# Patient Record
Sex: Female | Born: 2000 | Race: White | Hispanic: No | Marital: Single | Smoking: Never smoker
Health system: Southern US, Community
[De-identification: ages and names within clinical notes are randomized; demographics above are authoritative.]

## PROBLEM LIST (undated history)

## (undated) ENCOUNTER — Emergency Department: Admission: EM | Payer: Medicaid Other

## (undated) VITALS — BP 106/73 | HR 103 | Temp 98.0°F | Resp 15 | Ht 61.0 in | Wt 91.5 lb

## (undated) DIAGNOSIS — F909 Attention-deficit hyperactivity disorder, unspecified type: Secondary | ICD-10-CM

## (undated) DIAGNOSIS — J45909 Unspecified asthma, uncomplicated: Secondary | ICD-10-CM

## (undated) DIAGNOSIS — F419 Anxiety disorder, unspecified: Secondary | ICD-10-CM

---
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
## 2021-11-20 IMAGING — US US OB < 14 WEEKS - US OB TV
1 series · 14 of 28 positions shown · non-contrast
Comparison: 01/08/2020

CLINICAL DATA: Vaginal bleeding in 1st trimester pregnancy. First
trimester pregnancy with inconclusive fetal viability.

EXAM:
OBSTETRIC <14 WK US AND TRANSVAGINAL OB US
TECHNIQUE: Both transabdominal and transvaginal ultrasound examinations were
performed for complete evaluation of the gestation as well as the
maternal uterus, adnexal regions, and pelvic cul-de-sac.
Transvaginal technique was performed to assess early pregnancy.

[Series 1: us ob less than 14 weeks with ob transvaginal · 14 of 180 slices shown]
[im 7/180]
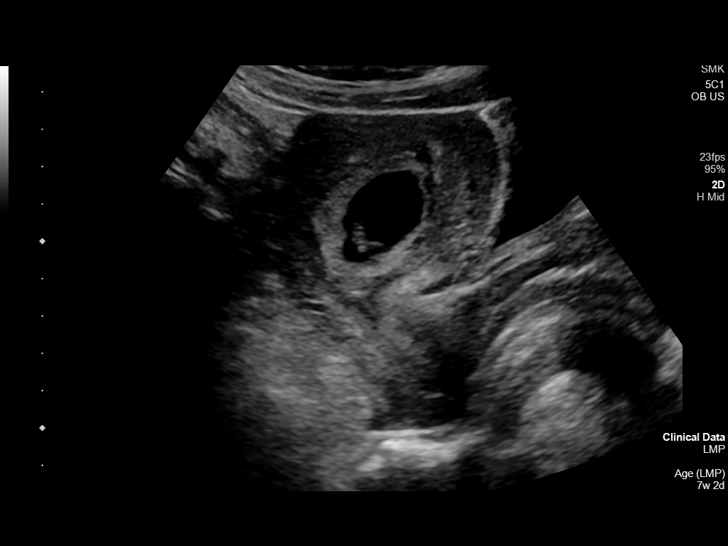
[im 20/180]
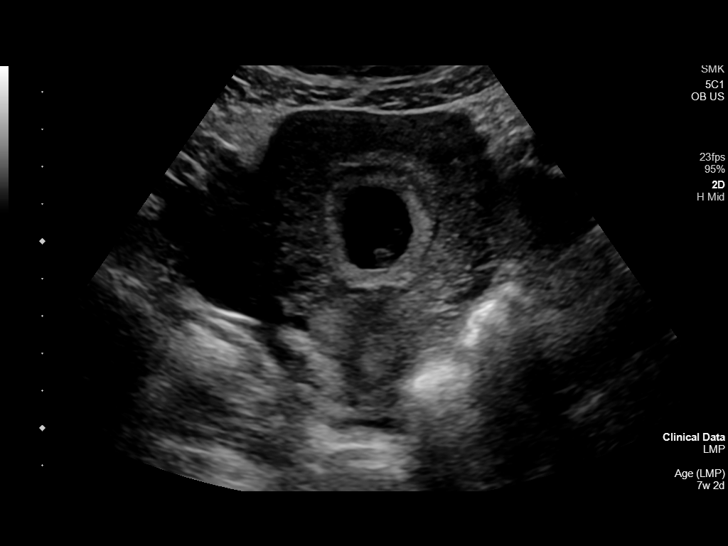
[im 34/180]
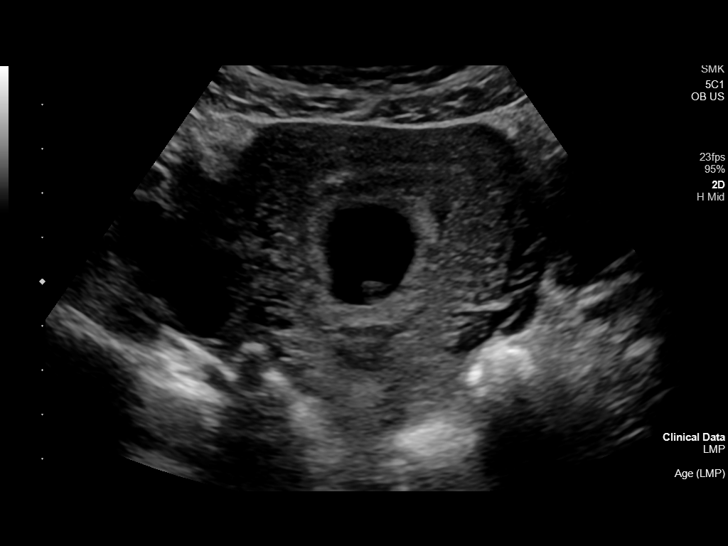
[im 47/180]
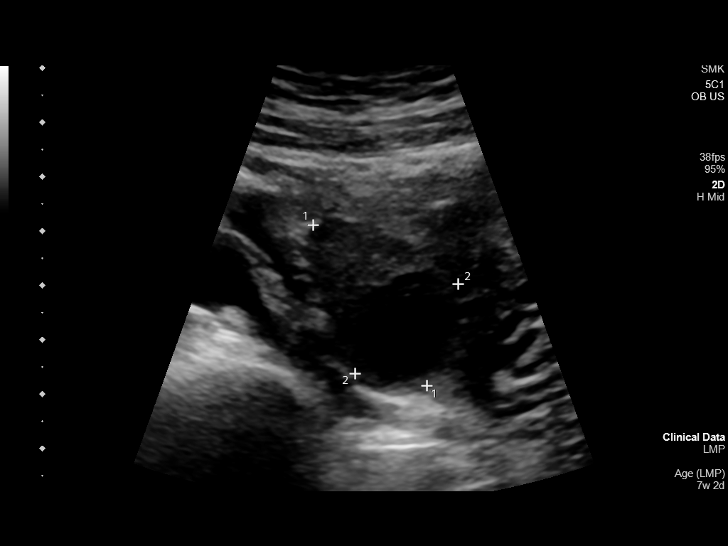
[im 60/180]
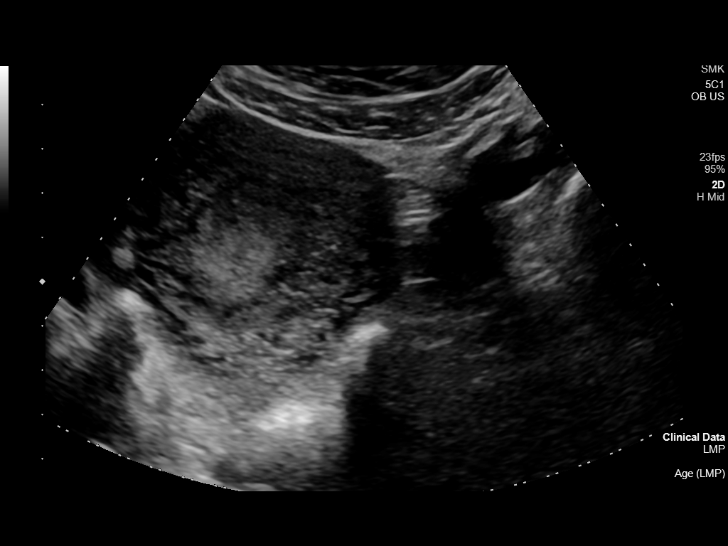
[im 73/180]
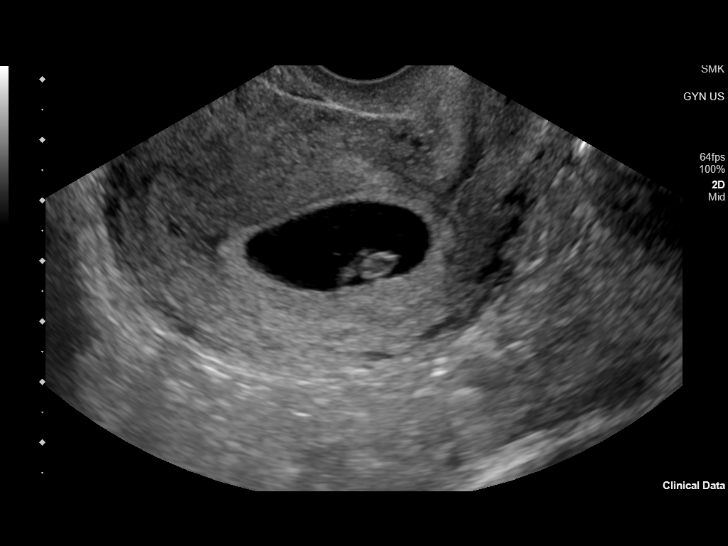
[im 87/180]
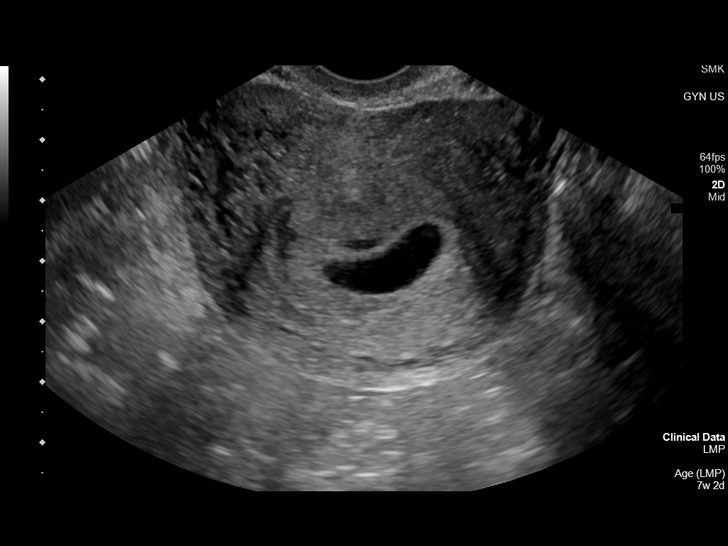
[im 100/180]
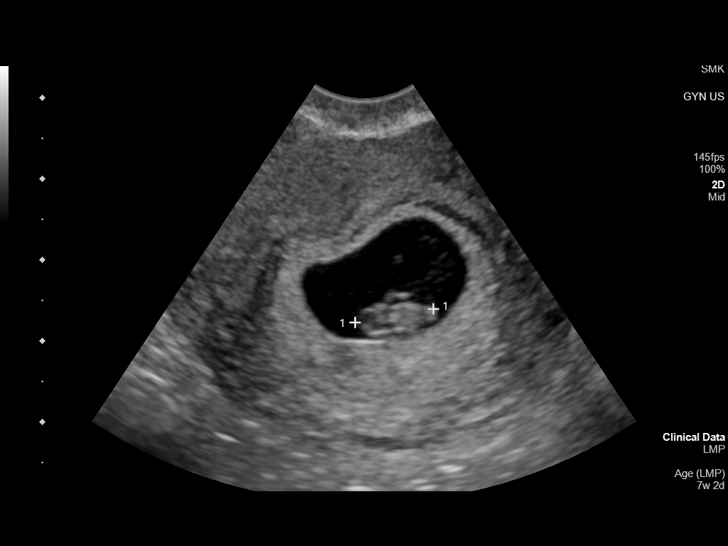
[im 113/180]
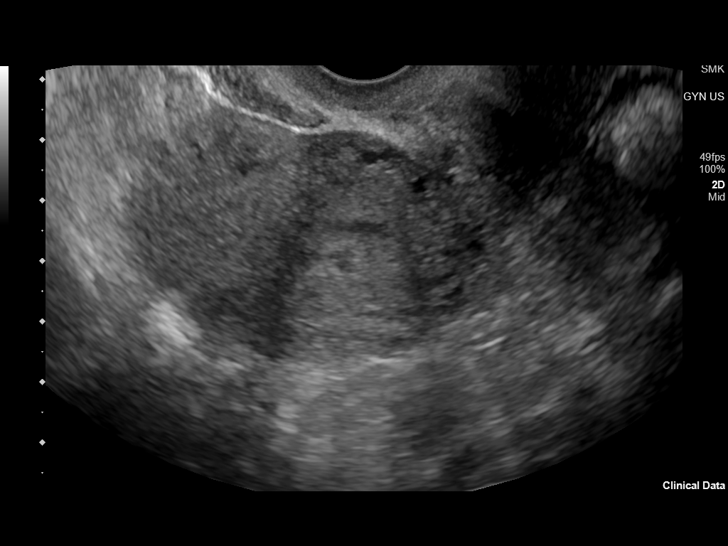
[im 126/180]
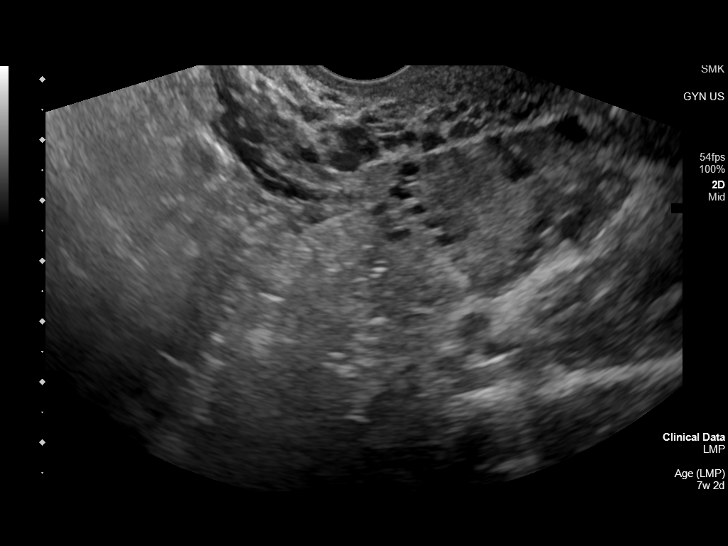
[im 140/180]
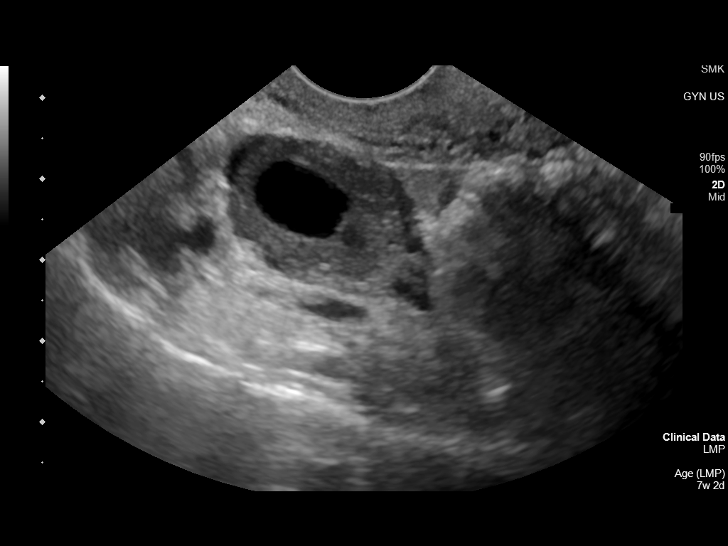
[im 153/180]
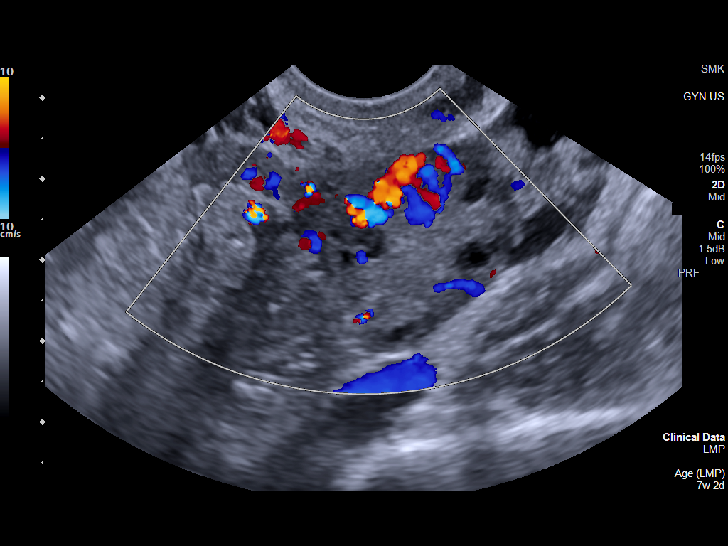
[im 166/180]
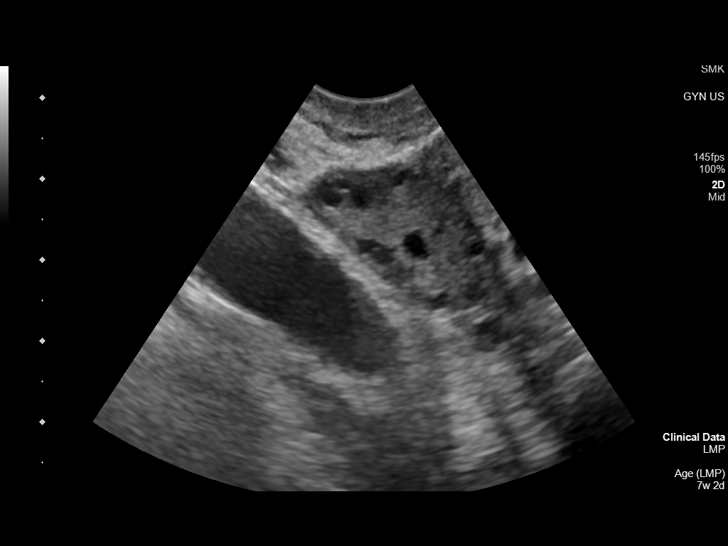
[im 180/180]
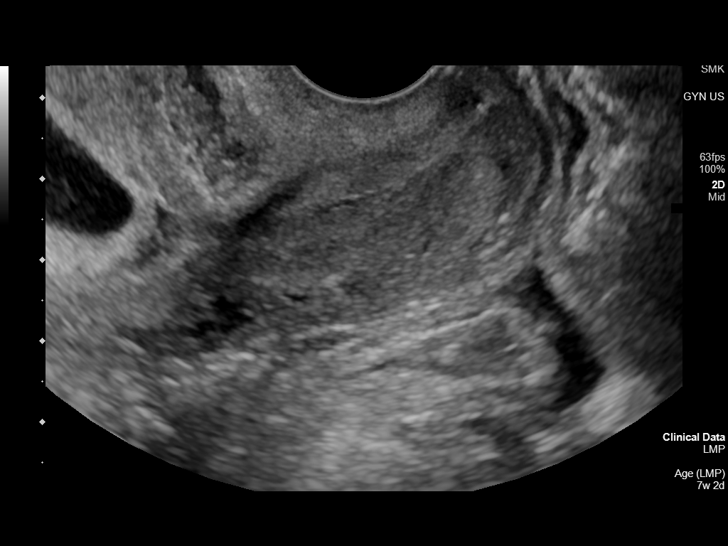

[14 of 28 positions shown; findings below may reference images not displayed]

FINDINGS: Intrauterine gestational sac: Single

Yolk sac:  Visualized.

Embryo:  Visualized.

Cardiac Activity: Visualized.

Heart Rate: 152 bpm

CRL:  10 mm   7 w   0 d                  US EDC: 09/05/2020

Subchorionic hemorrhage:  None visualized.

Maternal uterus/adnexae: Both ovaries are normal in appearance.
Small right ovarian corpus luteum cyst noted. No mass or abnormal
free fluid identified.
IMPRESSION: Single living IUP with estimated gestational age of 7 weeks 0 days,
and US EDC of 09/05/2020.

No maternal uterine or adnexal abnormality identified.

## 2022-01-25 ENCOUNTER — Encounter: Payer: Medicaid Other | Admitting: Certified Nurse Midwife

## 2022-03-06 ENCOUNTER — Emergency Department
Admission: EM | Admit: 2022-03-06 | Discharge: 2022-03-06 | Disposition: A | Discharge status: Home / Self Care | Payer: Medicaid Other | Attending: Emergency Medicine | Admitting: Emergency Medicine

## 2022-03-06 ENCOUNTER — Other Ambulatory Visit: Payer: Self-pay

## 2022-03-06 ENCOUNTER — Emergency Department: Payer: Medicaid Other

## 2022-03-06 ENCOUNTER — Encounter: Payer: Self-pay | Admitting: Emergency Medicine

## 2022-03-06 DIAGNOSIS — Z20822 Contact with and (suspected) exposure to covid-19: Secondary | ICD-10-CM | POA: Insufficient documentation

## 2022-03-06 DIAGNOSIS — B9789 Other viral agents as the cause of diseases classified elsewhere: Secondary | ICD-10-CM

## 2022-03-06 DIAGNOSIS — J069 Acute upper respiratory infection, unspecified: Secondary | ICD-10-CM | POA: Diagnosis not present

## 2022-03-06 DIAGNOSIS — J45909 Unspecified asthma, uncomplicated: Secondary | ICD-10-CM | POA: Diagnosis not present

## 2022-03-06 DIAGNOSIS — R509 Fever, unspecified: Secondary | ICD-10-CM | POA: Diagnosis present

## 2022-03-06 LAB — SARS CORONAVIRUS 2 BY RT PCR: SARS Coronavirus 2 by RT PCR: NEGATIVE

## 2022-03-06 MED ORDER — ALBUTEROL SULFATE HFA 108 (90 BASE) MCG/ACT IN AERS: 2.0000 | INHALATION_SPRAY | Freq: Four times a day (QID) | RESPIRATORY_TRACT | 2 refills | Status: AC | PRN

## 2022-03-06 MED ORDER — ONDANSETRON 4 MG PO TBDP
ORAL_TABLET | ORAL | Status: AC
  Filled 2022-03-06: qty 1

## 2022-03-06 MED ORDER — ONDANSETRON 4 MG PO TBDP: 4.0000 mg | ORAL_TABLET | Freq: Three times a day (TID) | ORAL | 0 refills | Status: AC | PRN

## 2022-03-06 MED ORDER — ONDANSETRON 4 MG PO TBDP
4.0000 mg | ORAL_TABLET | Freq: Once | ORAL | Status: AC
  Administered 2022-03-06: 4 mg via ORAL

## 2022-03-06 NOTE — ED Triage Notes (Signed)
Pt to ED from home c/o SOB x3 days, generalized body aches, fevers, chills, n/v.  States took home COVID test today that was negative.

## 2022-03-06 NOTE — ED Notes (Signed)
Lt green and lav tubes sent to lab with save labels.  No blood work at this time per Dr. Sidney Ace.

## 2022-03-06 NOTE — ED Notes (Signed)
Pt reports generalized body aches and fatigue. Also reports fevers and n/v. Pt reports home COVID test was negative. Pt noted to be ambulatory. Breathing is unlabored with symmetric chest rise and fall and pt speaking in full sentences. Pt is alert and oriented following commands appropriately. Denies chest pain or pressure.

## 2022-03-06 NOTE — Discharge Instructions (Addendum)
-  You may take Tylenol/ibuprofen as needed for body aches and fevers.  -You may take ondansetron as needed for nausea/vomiting.  -Follow-up with your primary care provider as needed.  -Return to the emergency department anytime if you begin to experience any new or worsening symptoms.

## 2022-03-06 NOTE — ED Provider Notes (Signed)
[facility] Provider Note    None    (approximate)   History   Chief Complaint Generalized Body Aches   HPI [name] is a 21 y.o. female, history of ADHD, somatic symptom disorder, GAD, MDD, asthma, presents the emergency department for evaluation of flulike symptoms.  Patient endorses body aches, fevers, chills, nausea/vomiting, and mild chest tightness.  Denies chest pain, abdominal pain, flank pain, dysuria, diarrhea, hematemesis, hematochezia, dizziness/lightheadedness, or rashes/lesions.   History Limitations: No limitations.        Physical Exam  Triage Vital Signs: ED Triage Vitals  Enc Vitals Group     BP 03/06/22 2209 118/86     Pulse Rate 03/06/22 2209 (!) 106     Resp 03/06/22 2209 18     Temp 03/06/22 2209 98.2 F (36.8 C)     Temp Source 03/06/22 2209 Oral     SpO2 03/06/22 2209 98 %     Weight 03/06/22 2159 135 lb (61.2 kg)     Height 03/06/22 2159 5\' 3"  (1.6 m)     Head Circumference --      Peak Flow --      Pain Score 03/06/22 2158 4     Pain Loc --      Pain Edu? --      Excl. in GC? --     Most recent vital signs: Vitals:   03/06/22 2209  BP: 118/86  Pulse: (!) 106  Resp: 18  Temp: 98.2 F (36.8 C)  SpO2: 98%    General: Awake, NAD.  Skin: Warm, dry. No rashes or lesions.  Eyes: PERRL. Conjunctivae normal.  CV: Good peripheral perfusion.  Resp: Normal effort.  Lung sounds are clear bilaterally in the apices and bases. Abd: Soft, non-tender. No distention.  Neuro: At baseline. No gross neurological deficits.   Focused Exam: N/A.  Physical Exam    ED Results / Procedures / Treatments  Labs (all labs ordered are listed, but only abnormal results are displayed) Labs Reviewed  SARS CORONAVIRUS 2 BY RT PCR     EKG N/A.   RADIOLOGY  ED Provider Interpretation: I personally viewed and interpreted this chest x-ray, no evidence of acute cardiopulmonary abnormalities.  DG Chest 2 View  Result  Date: 03/06/2022 CLINICAL DATA:  Shortness of breath and generalized body aches. EXAM: CHEST - 2 VIEW COMPARISON:  September 13, 2007 FINDINGS: The heart size and mediastinal contours are within normal limits. Both lungs are clear. The visualized skeletal structures are unremarkable. IMPRESSION: No active cardiopulmonary disease. Electronically Signed   By: September 15, 2007 M.D.   On: 03/06/2022 22:46    PROCEDURES:  Critical Care performed: N/A.  Procedures    MEDICATIONS ORDERED IN ED: Medications  ondansetron (ZOFRAN-ODT) disintegrating tablet 4 mg (has no administration in time range)     IMPRESSION / MDM / ASSESSMENT AND PLAN / ED COURSE  I reviewed the triage vital signs and the nursing notes.                              Differential diagnosis includes, but is not limited to, influenza, COVID-19, bronchitis, viral respiratory illness, allergic rhinitis, gastritis  ED Course Patient appears well, vitals within normal limits.  NAD.  COVID-19 PCR negative  Assessment/Plan Presentation consistent with viral syndrome.  COVID-19 PCR negative.  Chest x-ray shows no evidence of pneumonia.  She appears well clinically, though does endorse persistent nausea.  We will provide her with a prescription for ondansetron, as well as a albuterol inhaler.  Encouraged her to follow-up with her primary care provider as needed.  Will discharge.   Provided the patient with anticipatory guidance, return precautions, and educational material. Encouraged the patient to return to the emergency department at any time if they begin to experience any new or worsening symptoms. Patient expressed understanding and agreed with the plan.   Patient's presentation is most consistent with acute complicated illness / injury requiring diagnostic workup.     FINAL CLINICAL IMPRESSION(S) / ED DIAGNOSES   Final diagnoses:  Viral respiratory illness     Rx / DC Orders   ED Discharge Orders           Ordered    albuterol (VENTOLIN HFA) 108 (90 Base) MCG/ACT inhaler  Every 6 hours PRN        03/06/22 2312    ondansetron (ZOFRAN-ODT) 4 MG disintegrating tablet  Every 8 hours PRN        03/06/22 2312             Note:  This document was prepared using Dragon voice recognition software and may include unintentional dictation errors.   Varney Daily, [place] 03/06/22 2324    Georga Hacking, MD 03/07/22 [phone]

## 2022-03-08 ENCOUNTER — Other Ambulatory Visit: Payer: Self-pay

## 2022-03-08 ENCOUNTER — Emergency Department: Payer: Medicaid Other

## 2022-03-08 ENCOUNTER — Emergency Department
Admission: EM | Admit: 2022-03-08 | Discharge: 2022-03-08 | Disposition: A | Discharge status: Home / Self Care | Payer: Medicaid Other | Attending: Emergency Medicine | Admitting: Emergency Medicine

## 2022-03-08 DIAGNOSIS — N200 Calculus of kidney: Secondary | ICD-10-CM | POA: Diagnosis not present

## 2022-03-08 DIAGNOSIS — J45909 Unspecified asthma, uncomplicated: Secondary | ICD-10-CM | POA: Insufficient documentation

## 2022-03-08 DIAGNOSIS — N3000 Acute cystitis without hematuria: Secondary | ICD-10-CM

## 2022-03-08 DIAGNOSIS — N12 Tubulo-interstitial nephritis, not specified as acute or chronic: Secondary | ICD-10-CM

## 2022-03-08 DIAGNOSIS — R319 Hematuria, unspecified: Secondary | ICD-10-CM | POA: Diagnosis present

## 2022-03-08 DIAGNOSIS — N3001 Acute cystitis with hematuria: Secondary | ICD-10-CM | POA: Insufficient documentation

## 2022-03-08 LAB — URINALYSIS, ROUTINE W REFLEX MICROSCOPIC
Bilirubin Urine: NEGATIVE
Glucose, UA: NEGATIVE mg/dL
Ketones, ur: 80 mg/dL — AB
Nitrite: POSITIVE — AB
Protein, ur: 30 mg/dL — AB
Specific Gravity, Urine: >1.046 — ABNORMAL HIGH (ref 1.005–1.030)
pH: 6 (ref 5.0–8.0)

## 2022-03-08 LAB — CBC WITH DIFFERENTIAL/PLATELET
Abs Immature Granulocytes: 0.07 10*3/uL (ref 0.00–0.07)
Basophils Absolute: 0 10*3/uL (ref 0.0–0.1)
Basophils Relative: 0 %
Eosinophils Absolute: 0.2 10*3/uL (ref 0.0–0.5)
Eosinophils Relative: 2 %
HCT: 38.5 % (ref 36.0–46.0)
Hemoglobin: 12.9 g/dL (ref 12.0–15.0)
Immature Granulocytes: 1 %
Lymphocytes Relative: 11 %
Lymphs Abs: 1 10*3/uL (ref 0.7–4.0)
MCH: 27.4 pg (ref 26.0–34.0)
MCHC: 33.5 g/dL (ref 30.0–36.0)
MCV: 81.7 fL (ref 80.0–100.0)
Monocytes Absolute: 0.8 10*3/uL (ref 0.1–1.0)
Monocytes Relative: 10 %
Neutro Abs: 6.5 10*3/uL (ref 1.7–7.7)
Neutrophils Relative %: 76 %
Platelets: 256 10*3/uL (ref 150–400)
RBC: 4.71 MIL/uL (ref 3.87–5.11)
RDW: 12.3 % (ref 11.5–15.5)
WBC: 8.6 10*3/uL (ref 4.0–10.5)
nRBC: 0 % (ref 0.0–0.2)

## 2022-03-08 LAB — COMPREHENSIVE METABOLIC PANEL
ALT: 35 U/L (ref 0–44)
AST: 25 U/L (ref 15–41)
Albumin: 3.5 g/dL (ref 3.5–5.0)
Alkaline Phosphatase: 88 U/L (ref 38–126)
Anion gap: 14 (ref 5–15)
BUN: 11 mg/dL (ref 6–20)
CO2: 19 mmol/L — ABNORMAL LOW (ref 22–32)
Calcium: 8.7 mg/dL — ABNORMAL LOW (ref 8.9–10.3)
Chloride: 102 mmol/L (ref 98–111)
Creatinine, Ser: 0.7 mg/dL (ref 0.44–1.00)
GFR, Estimated: >60 mL/min (ref >60)
Glucose, Bld: 118 mg/dL — ABNORMAL HIGH (ref 70–99)
Potassium: 3.3 mmol/L — ABNORMAL LOW (ref 3.5–5.1)
Sodium: 135 mmol/L (ref 135–145)
Total Bilirubin: 1.1 mg/dL (ref 0.3–1.2)
Total Protein: 7.3 g/dL (ref 6.5–8.1)

## 2022-03-08 LAB — TROPONIN I (HIGH SENSITIVITY): Troponin I (High Sensitivity): 4 ng/L (ref <18)

## 2022-03-08 LAB — HCG, QUANTITATIVE, PREGNANCY: hCG, Beta Chain, Quant, S: <1 m[IU]/mL (ref <5)

## 2022-03-08 LAB — LACTIC ACID, PLASMA: Lactic Acid, Venous: 0.7 mmol/L (ref 0.5–1.9)

## 2022-03-08 MED ORDER — SULFAMETHOXAZOLE-TRIMETHOPRIM 800-160 MG PO TABS
1.0000 | ORAL_TABLET | Freq: Once | ORAL | Status: AC
  Administered 2022-03-08: 1 via ORAL
  Filled 2022-03-08: qty 1

## 2022-03-08 MED ORDER — SODIUM CHLORIDE 0.9 % IV SOLN: 12.5000 mg | Freq: Four times a day (QID) | INTRAVENOUS | Status: DC | PRN

## 2022-03-08 MED ORDER — IOHEXOL 300 MG/ML  SOLN
100.0000 mL | Freq: Once | INTRAMUSCULAR | Status: AC | PRN
  Administered 2022-03-08: 100 mL via INTRAVENOUS

## 2022-03-08 MED ORDER — PROMETHAZINE HCL 25 MG PO TABS: 25.0000 mg | ORAL_TABLET | Freq: Four times a day (QID) | ORAL | 0 refills | Status: DC | PRN

## 2022-03-08 MED ORDER — LACTATED RINGERS IV BOLUS
1000.0000 mL | Freq: Once | INTRAVENOUS | Status: AC
  Administered 2022-03-08: 1000 mL via INTRAVENOUS

## 2022-03-08 MED ORDER — SULFAMETHOXAZOLE-TRIMETHOPRIM 800-160 MG PO TABS: 1.0000 | ORAL_TABLET | Freq: Two times a day (BID) | ORAL | 0 refills | Status: AC

## 2022-03-08 MED ORDER — IBUPROFEN 600 MG PO TABS
600.0000 mg | ORAL_TABLET | Freq: Once | ORAL | Status: AC
  Administered 2022-03-08: 600 mg via ORAL
  Filled 2022-03-08: qty 1

## 2022-03-08 MED ORDER — SODIUM CHLORIDE 0.9 % IV BOLUS
1000.0000 mL | Freq: Once | INTRAVENOUS | Status: AC
  Administered 2022-03-08: 1000 mL via INTRAVENOUS

## 2022-03-08 MED ORDER — MORPHINE SULFATE (PF) 4 MG/ML IV SOLN
4.0000 mg | Freq: Once | INTRAVENOUS | Status: AC
  Administered 2022-03-08: 4 mg via INTRAVENOUS
  Filled 2022-03-08: qty 1

## 2022-03-08 NOTE — ED Notes (Signed)
Transported to CT 

## 2022-03-08 NOTE — ED Triage Notes (Addendum)
Pt presents to ER with c/o body aches, hematuria, and fever that started Friday last week.  Pt states symptoms started when her period started.  Pt states pain is in left groin area.  Pt denies sore throat, cough, loss of smell or taste.  Pt states she has also had much less frequent urination.  Pt is A&O x4 at this time, and appears lethargic in triage.  Pt states she took tylenol at 1700 today.

## 2022-03-08 NOTE — Discharge Instructions (Addendum)
Your tests today reveal a urinary tract infection which is involving your bladder and right kidney.  Take Bactrim as prescribed, 2 times a day, to eradicate this infection.  Use promethazine as needed for nausea.  Take ibuprofen 600 mg and Tylenol 675 mg every 6 hours as needed for pain or fever.

## 2022-03-08 NOTE — ED Notes (Signed)
Beverage and crackers provided for PO challenge

## 2022-03-08 NOTE — ED Notes (Signed)
PO challenge successful.

## 2022-03-08 NOTE — ED Provider Notes (Signed)
[facility] Provider Note    Event Date/Time   First MD Initiated Contact with Patient 03/08/22 2004     (approximate)   History   Chief Complaint: Hematuria   HPI  [name] is a 21 y.o. female with a history of ADHD, asthma, anxiety who comes the ED complaining of body aches, hematuria, fever for the past 5 days, gradual onset.  Pain is worse in the right flank.  Also reports some nausea and vomiting but tolerating liquids.  No chest pain shortness of breath or cough.  She does have dysuria and urgency.  She was seen in the ED 2 days ago, had reassuring work-up, COVID test negative.     Physical Exam   Triage Vital Signs: ED Triage Vitals  Enc Vitals Group     BP 03/08/22 1950 109/67     Pulse Rate 03/08/22 1950 (!) 162     Resp 03/08/22 1950 (!) 26     Temp 03/08/22 1950 (!) 103.3 F (39.6 C)     Temp Source 03/08/22 1950 Oral     SpO2 03/08/22 1950 95 %     Weight --      Height --      Head Circumference --      Peak Flow --      Pain Score 03/08/22 1953 9     Pain Loc --      Pain Edu? --      Excl. in GC? --     Most recent vital signs: Vitals:   03/08/22 2216 03/08/22 2230  BP:  100/62  Pulse:  89  Resp:  (!) 39  Temp: 99.8 F (37.7 C)   SpO2:  98%    General: Awake, no distress.  CV:  Good peripheral perfusion.  Tachycardia heart rate 120.  No murmur.  Symmetric distal pulses Resp:  Normal effort.  Clear to auscultation bilaterally.  Respiratory rate 18 Abd:  No distention.  Soft, right-sided abdominal tenderness and suprapubic tenderness.  Right CVA tenderness. Other:  Dry mucous membranes.  No rash.   ED Results / Procedures / Treatments   Labs (all labs ordered are listed, but only abnormal results are displayed) Labs Reviewed  COMPREHENSIVE METABOLIC PANEL - Abnormal; Notable for the following components:      Result Value   Potassium 3.3 (*)    CO2 19 (*)    Glucose, Bld 118 (*)    Calcium 8.7 (*)     All other components within normal limits  URINALYSIS, ROUTINE W REFLEX MICROSCOPIC - Abnormal; Notable for the following components:   Color, Urine YELLOW (*)    APPearance CLEAR (*)    Specific Gravity, Urine >1.046 (*)    Hgb urine dipstick SMALL (*)    Ketones, ur 80 (*)    Protein, ur 30 (*)    Nitrite POSITIVE (*)    Leukocytes,Ua SMALL (*)    Bacteria, UA RARE (*)    All other components within normal limits  CULTURE, BLOOD (ROUTINE X 2)  CULTURE, BLOOD (ROUTINE X 2)  URINE CULTURE  CBC WITH DIFFERENTIAL/PLATELET  LACTIC ACID, PLASMA  HCG, QUANTITATIVE, PREGNANCY  TROPONIN I (HIGH SENSITIVITY)     EKG Interpreted by me Sinus tachycardia rate 136.  Normal axis and intervals.  Normal QRS ST segments and T waves.  Repeat EKG shows sinus tachycardia rate 114.  No acute interval change.   RADIOLOGY CT abdomen pelvis interpreted by me, negative for appendicitis,  intra-abdominal abscess, or obstructing ureterolithiasis.  Radiology report reviewed, noting signs of right pyelonephritis.   PROCEDURES:  Procedures   MEDICATIONS ORDERED IN ED: Medications  promethazine (PHENERGAN) 12.5 mg in sodium chloride 0.9 % 50 mL IVPB (has no administration in time range)  ibuprofen (ADVIL) tablet 600 mg (600 mg Oral Given 03/08/22 2021)  lactated ringers bolus 1,000 mL (1,000 mLs Intravenous New Bag/Given 03/08/22 2116)  morphine (PF) 4 MG/ML injection 4 mg (4 mg Intravenous Given 03/08/22 2115)  iohexol (OMNIPAQUE) 300 MG/ML solution 100 mL (100 mLs Intravenous Contrast Given 03/08/22 2103)  sodium chloride 0.9 % bolus 1,000 mL (1,000 mLs Intravenous New Bag/Given 03/08/22 2146)  sulfamethoxazole-trimethoprim (BACTRIM DS) 800-160 MG per tablet 1 tablet (1 tablet Oral Given 03/08/22 2143)     IMPRESSION / MDM / ASSESSMENT AND PLAN / ED COURSE  I reviewed the triage vital signs and the nursing notes.                              Differential diagnosis includes, but is not limited to,  UTI, pyelonephritis, appendicitis, viral illness, dehydration, AKI, electrolyte abnormality  Patient's presentation is most consistent with acute presentation with potential threat to life or bodily function.  Patient presents with abdominal pain with abnormal abdominal exam, fever tachycardia.  She is nontoxic, not septic.  With IV fluids and supportive care, vital signs have normalized.  Labs and CT show cystitis and pyelonephritis.  Serum labs are reassuring, pregnancy is negative.  She is tolerating oral intake, pain is controlled, started on Bactrim (due to Keflex allergy).  Urine culture sent.  Due to improvement of symptoms and ability to take medications and stay hydrated at home, she does not require admission.  Suitable for primary care follow-up.       FINAL CLINICAL IMPRESSION(S) / ED DIAGNOSES   Final diagnoses:  Acute cystitis without hematuria  Pyelonephritis     Rx / DC Orders   ED Discharge Orders          Ordered    sulfamethoxazole-trimethoprim (BACTRIM DS) 800-160 MG tablet  2 times daily        03/08/22 2246    promethazine (PHENERGAN) 25 MG tablet  Every 6 hours PRN        03/08/22 2246             Note:  This document was prepared using Dragon voice recognition software and may include unintentional dictation errors.   Sharman Cheek, MD 03/08/22 2253

## 2022-03-11 LAB — URINE CULTURE: Culture: >=100000 — AB

## 2022-03-14 LAB — CULTURE, BLOOD (ROUTINE X 2)
Culture: NO GROWTH
Culture: NO GROWTH
Special Requests: ADEQUATE
Special Requests: ADEQUATE

## 2023-03-08 ENCOUNTER — Other Ambulatory Visit: Payer: Self-pay

## 2023-03-08 ENCOUNTER — Ambulatory Visit (INDEPENDENT_AMBULATORY_CARE_PROVIDER_SITE_OTHER): Payer: Medicaid Other

## 2023-03-08 VITALS — BP 110/69 | HR 87 | Ht 63.0 in | Wt 194.6 lb

## 2023-03-08 DIAGNOSIS — O200 Threatened abortion: Secondary | ICD-10-CM

## 2023-03-08 DIAGNOSIS — Z3201 Encounter for pregnancy test, result positive: Secondary | ICD-10-CM

## 2023-03-08 DIAGNOSIS — O209 Hemorrhage in early pregnancy, unspecified: Secondary | ICD-10-CM

## 2023-03-08 DIAGNOSIS — Z3481 Encounter for supervision of other normal pregnancy, first trimester: Secondary | ICD-10-CM

## 2023-03-08 DIAGNOSIS — N912 Amenorrhea, unspecified: Secondary | ICD-10-CM

## 2023-03-08 DIAGNOSIS — O3680X0 Pregnancy with inconclusive fetal viability, not applicable or unspecified: Secondary | ICD-10-CM

## 2023-03-08 DIAGNOSIS — Z3200 Encounter for pregnancy test, result unknown: Secondary | ICD-10-CM

## 2023-03-08 LAB — POCT URINE PREGNANCY: Preg Test, Ur: POSITIVE — AB

## 2023-03-08 NOTE — Progress Notes (Signed)
    NURSE VISIT NOTE  Subjective:    Patient ID: [name], female    DOB: 2001-05-16, 22 y.o.   MRN: [id number]  HPI  Patient is a 22 y.o. G53P0010 female who presents for evaluation of amenorrhea. She believes she could be pregnant. Patient is ambivalent about pregnancy. Sexual Activity: single partner, contraception: none. Current symptoms also include: positive home pregnancy test. Last period was normal. Patient states light bleeding lasting from 03/03/23-03/04/23 along with cramping, this has caused her some stress.     Objective:    BP 110/69   Pulse 87   Ht 5\' 3"  (1.6 m)   Wt 194 lb 9.6 oz (88.3 kg)   LMP 01/16/2023 (Approximate)   BMI 34.47 kg/m   Lab Review  Results for orders placed or performed in visit on 03/08/23  POCT urine pregnancy  Result Value Ref Range   Preg Test, Ur Positive (A) Negative    Assessment:   1. Possible pregnancy, not yet confirmed   2. Bleeding in early pregnancy   3. Bloody show and cramping in early pregnancy   4. Pregnancy with uncertain fetal viability, single or unspecified fetus   5. Encounter for supervision of other normal pregnancy in first trimester     Plan:   Pregnancy Test: Positive  Estimated Date of Delivery: 10/23/23 Encouraged well-balanced diet, plenty of rest when needed, pre-natal vitamins daily and walking for exercise.  Discussed self-help for nausea, avoiding OTC medications until consulting provider or pharmacist, other than Tylenol as needed, minimal caffeine (1-2 cups daily) and avoiding alcohol.   She will schedule her nurse visit @ 7-[redacted] wks pregnant, u/s for dating @10  wk, and NOB visit at [redacted] wk pregnant.    Feel free to call with any questions.     Loman Chroman, CMA

## 2023-03-09 LAB — BETA HCG QUANT (REF LAB): hCG Quant: 23447 m[IU]/mL

## 2023-03-11 ENCOUNTER — Other Ambulatory Visit: Payer: Medicaid Other

## 2023-03-11 DIAGNOSIS — O209 Hemorrhage in early pregnancy, unspecified: Secondary | ICD-10-CM

## 2023-03-11 DIAGNOSIS — Z3481 Encounter for supervision of other normal pregnancy, first trimester: Secondary | ICD-10-CM

## 2023-03-11 DIAGNOSIS — O200 Threatened abortion: Secondary | ICD-10-CM

## 2023-03-12 ENCOUNTER — Other Ambulatory Visit: Payer: Self-pay | Admitting: Obstetrics

## 2023-03-12 ENCOUNTER — Ambulatory Visit (INDEPENDENT_AMBULATORY_CARE_PROVIDER_SITE_OTHER): Payer: Medicaid Other

## 2023-03-12 DIAGNOSIS — Z3687 Encounter for antenatal screening for uncertain dates: Secondary | ICD-10-CM

## 2023-03-12 DIAGNOSIS — Z3200 Encounter for pregnancy test, result unknown: Secondary | ICD-10-CM

## 2023-03-12 DIAGNOSIS — O3680X0 Pregnancy with inconclusive fetal viability, not applicable or unspecified: Secondary | ICD-10-CM

## 2023-03-12 DIAGNOSIS — Z3A01 Less than 8 weeks gestation of pregnancy: Secondary | ICD-10-CM | POA: Diagnosis not present

## 2023-03-12 DIAGNOSIS — O209 Hemorrhage in early pregnancy, unspecified: Secondary | ICD-10-CM

## 2023-03-12 DIAGNOSIS — Z3481 Encounter for supervision of other normal pregnancy, first trimester: Secondary | ICD-10-CM

## 2023-03-12 DIAGNOSIS — O200 Threatened abortion: Secondary | ICD-10-CM

## 2023-03-12 LAB — BETA HCG QUANT (REF LAB): hCG Quant: 40303 m[IU]/mL

## 2023-03-13 ENCOUNTER — Encounter: Payer: Self-pay | Admitting: Obstetrics

## 2023-03-19 ENCOUNTER — Telehealth: Payer: Self-pay

## 2023-03-19 NOTE — Telephone Encounter (Signed)
Pt calling to see what she can take for a cold; also taking rx for UTI.  [phone] Adv per Safe Meds list; encouraged pt to go get tested for covid, RSV, and flu; tx sxs; if has covid to call for rx of paxlovid.

## 2023-03-20 ENCOUNTER — Other Ambulatory Visit: Payer: Self-pay | Admitting: Obstetrics

## 2023-03-20 NOTE — Telephone Encounter (Signed)
Pt calling triage following up on yesterday's phone call. She did a home COVID test and she had positive result. She called the after hours nurse line to tell them this and she was advised to go to ED, which she didn't cause she couldn't go. Only symptoms she is having is congestion and sore throat, and some mild fevers. Can she get Rx that was advised we would call in if she tested positive for COVID?

## 2023-03-20 NOTE — Telephone Encounter (Signed)
Spoke to MMF, she will call pharmacy to give verbal since she was having trouble sending Rx. Pt aware to start Rx today.

## 2023-03-26 ENCOUNTER — Emergency Department: Payer: Medicaid Other

## 2023-03-26 ENCOUNTER — Other Ambulatory Visit: Payer: Self-pay

## 2023-03-26 ENCOUNTER — Encounter: Payer: Self-pay | Admitting: Emergency Medicine

## 2023-03-26 ENCOUNTER — Emergency Department
Admission: EM | Admit: 2023-03-26 | Discharge: 2023-03-27 | Disposition: A | Discharge status: Home / Self Care | Payer: Medicaid Other | Attending: Emergency Medicine | Admitting: Emergency Medicine

## 2023-03-26 DIAGNOSIS — O26891 Other specified pregnancy related conditions, first trimester: Secondary | ICD-10-CM | POA: Diagnosis present

## 2023-03-26 DIAGNOSIS — Z3A08 8 weeks gestation of pregnancy: Secondary | ICD-10-CM | POA: Diagnosis not present

## 2023-03-26 DIAGNOSIS — N898 Other specified noninflammatory disorders of vagina: Secondary | ICD-10-CM | POA: Diagnosis not present

## 2023-03-26 DIAGNOSIS — R1030 Lower abdominal pain, unspecified: Secondary | ICD-10-CM | POA: Insufficient documentation

## 2023-03-26 LAB — CBC
HCT: 40.1 % (ref 36.0–46.0)
Hemoglobin: 13.7 g/dL (ref 12.0–15.0)
MCH: 28.7 pg (ref 26.0–34.0)
MCHC: 34.2 g/dL (ref 30.0–36.0)
MCV: 84.1 fL (ref 80.0–100.0)
Platelets: 256 10*3/uL (ref 150–400)
RBC: 4.77 MIL/uL (ref 3.87–5.11)
RDW: 12.5 % (ref 11.5–15.5)
WBC: 10.9 10*3/uL — ABNORMAL HIGH (ref 4.0–10.5)
nRBC: 0 % (ref 0.0–0.2)

## 2023-03-26 LAB — COMPREHENSIVE METABOLIC PANEL
ALT: 21 U/L (ref 0–44)
AST: 19 U/L (ref 15–41)
Albumin: 3.9 g/dL (ref 3.5–5.0)
Alkaline Phosphatase: 61 U/L (ref 38–126)
Anion gap: 8 (ref 5–15)
BUN: 10 mg/dL (ref 6–20)
CO2: 21 mmol/L — ABNORMAL LOW (ref 22–32)
Calcium: 9.1 mg/dL (ref 8.9–10.3)
Chloride: 104 mmol/L (ref 98–111)
Creatinine, Ser: 0.46 mg/dL (ref 0.44–1.00)
GFR, Estimated: >60 mL/min (ref >60)
Glucose, Bld: 101 mg/dL — ABNORMAL HIGH (ref 70–99)
Potassium: 4 mmol/L (ref 3.5–5.1)
Sodium: 133 mmol/L — ABNORMAL LOW (ref 135–145)
Total Bilirubin: 0.4 mg/dL (ref 0.3–1.2)
Total Protein: 7.5 g/dL (ref 6.5–8.1)

## 2023-03-26 LAB — WET PREP, GENITAL
Clue Cells Wet Prep HPF POC: NONE SEEN
Sperm: NONE SEEN
Trich, Wet Prep: NONE SEEN
WBC, Wet Prep HPF POC: <10 (ref <10)
Yeast Wet Prep HPF POC: NONE SEEN

## 2023-03-26 LAB — LIPASE, BLOOD: Lipase: 34 U/L (ref 11–51)

## 2023-03-26 LAB — URINALYSIS, COMPLETE (UACMP) WITH MICROSCOPIC
Bilirubin Urine: NEGATIVE
Glucose, UA: NEGATIVE mg/dL
Hgb urine dipstick: NEGATIVE
Ketones, ur: NEGATIVE mg/dL
Leukocytes,Ua: NEGATIVE
Nitrite: NEGATIVE
Protein, ur: NEGATIVE mg/dL
Specific Gravity, Urine: 1.006 (ref 1.005–1.030)
pH: 7 (ref 5.0–8.0)

## 2023-03-26 LAB — CHLAMYDIA/NGC RT PCR (ARMC ONLY)
Chlamydia Tr: NOT DETECTED
N gonorrhoeae: NOT DETECTED

## 2023-03-26 LAB — HCG, QUANTITATIVE, PREGNANCY: hCG, Beta Chain, Quant, S: 165904 m[IU]/mL — ABNORMAL HIGH (ref <5)

## 2023-03-26 NOTE — ED Notes (Signed)
Pt back from US

## 2023-03-26 NOTE — Telephone Encounter (Signed)
Patient states she was taking abx for UTI. She completed two days ago. She is now having burning with urination again as well as lower abdominal discomfort. Discussed details with patient. The UTI was dx and treated by Urgent Care. We do not have those records. Patient is 7 days s/p +Covid test and not advised to be in office til 10 days have passed. Advised to contact Urgent care that treated for UTI for refill or go to ED.

## 2023-03-26 NOTE — ED Provider Notes (Signed)
[facility] Provider Note    Event Date/Time   First MD Initiated Contact with Patient 03/26/23 2144     (approximate)  History   Chief Complaint: Abdominal Cramping and Dysuria  HPI  Lekita Dibble is a 22 y.o. female G2, P1 with a past medical history of anxiety who presents to the emergency department for lower abdominal cramping.  According to the patient 2 weeks ago she was seen in the emergency department for lower abdominal cramping was having some vaginal bleeding at that time.  Has not had any further vaginal bleeding.  States very minimal discharge.  Patient states she was having dysuria symptoms and was treated with an antibiotic has finished the course of antibiotics but continues to have slight discomfort when she urinates at times.  Also states for the last week or so she has been experiencing intermittent lower abdominal cramping.  Physical Exam   Triage Vital Signs: ED Triage Vitals  Encounter Vitals Group     BP 03/26/23 2120 (!) 126/91     Systolic BP Percentile --      Diastolic BP Percentile --      Pulse Rate 03/26/23 2120 (!) 104     Resp 03/26/23 2120 18     Temp 03/26/23 2120 99 F (37.2 C)     Temp Source 03/26/23 2120 Oral     SpO2 03/26/23 2120 97 %     Weight 03/26/23 2118 194 lb 9.6 oz (88.3 kg)     Height 03/26/23 2118 5\' 3"  (1.6 m)     Head Circumference --      Peak Flow --      Pain Score 03/26/23 2117 6     Pain Loc --      Pain Education --      Exclude from Growth Chart --     Most recent vital signs: Vitals:   03/26/23 2120  BP: (!) 126/91  Pulse: (!) 104  Resp: 18  Temp: 99 F (37.2 C)  SpO2: 97%    General: Awake, no distress.  CV:  Good peripheral perfusion.  Regular rate and rhythm  Resp:  Normal effort.  Equal breath sounds bilaterally.  Abd:  No distention.  Soft, nontender.  No rebound or guarding.  ED Results / Procedures / Treatments   RADIOLOGY  Ultrasound pending   MEDICATIONS  ORDERED IN ED: Medications - No data to display   IMPRESSION / MDM / ASSESSMENT AND PLAN / ED COURSE  I reviewed the triage vital signs and the nursing notes.  Patient's presentation is most consistent with acute presentation with potential threat to life or bodily function.  Patient presents to the emergency department for abdominal cramping approximately [redacted] weeks pregnant.  States was having vaginal bleeding 2 weeks ago.  Will obtain an ultrasound to further evaluate as well as a quantitative beta-hCG.  Given the patient's continued slight dysuria with mild amount of discharge we will obtain pelvic swabs via self swab to check for STDs as well as wet prep.  We will recheck a urine sample.  Patient's lab work so far is reassuring with a reassuring CBC, reassuring chemistry and a negative lipase.  Patient's wet prep is negative.  Ultrasound and urinalysis pending.  Patient care signed out to oncoming provider.  FINAL CLINICAL IMPRESSION(S) / ED DIAGNOSES   Abdominal cramping during pregnancy    Note:  This document was prepared using Dragon voice recognition software and may include unintentional dictation errors.  Minna Antis, MD 03/26/23 2237

## 2023-03-26 NOTE — ED Triage Notes (Signed)
Pt arrived via POV with reports of abdominal cramping for the past couple of days and dysuria. Pt was dx with UTI on 8/24, completed a 5 day course of antibiotics, but states when she is finished urinating she still feels like she has to go and has pain.  Pt also is 8 weeks 2 days pregnant, states she had vaginal bleeding about a week ago for 1-2 days that was like a period.  Pt now having cramping.   G2P1 - OB will be at [facility].

## 2023-03-26 NOTE — Discharge Instructions (Addendum)
Please follow-up with your OB/GYN as soon as possible.  Return to the emergency department for any symptom concerning to yourself.  Use Tylenol for pain and fevers.  Up to 1000 mg per dose, up to 4 times per day.  Do not take more than 4000 mg of Tylenol/acetaminophen within 24 hours.Marland Kitchen

## 2023-03-27 NOTE — ED Provider Notes (Signed)
Patient received in signout pending pelvic ultrasound.  This returns with no evidence of acute pathology.  IUP measuring 8 weeks, concordant with her dates.  We discussed possible etiologies of her cramping.  We discussed OB follow-up and Tylenol in the meantime.  Patient suitable for outpatient management   Delton Prairie, MD 03/27/23 [phone]

## 2023-04-01 ENCOUNTER — Ambulatory Visit (INDEPENDENT_AMBULATORY_CARE_PROVIDER_SITE_OTHER): Payer: Medicaid Other | Admitting: Advanced Practice Midwife

## 2023-04-01 ENCOUNTER — Encounter: Payer: Self-pay | Admitting: Advanced Practice Midwife

## 2023-04-01 VITALS — BP 121/79 | HR 85 | Wt 190.3 lb

## 2023-04-01 DIAGNOSIS — N898 Other specified noninflammatory disorders of vagina: Secondary | ICD-10-CM | POA: Diagnosis not present

## 2023-04-01 DIAGNOSIS — Z01419 Encounter for gynecological examination (general) (routine) without abnormal findings: Secondary | ICD-10-CM

## 2023-04-01 NOTE — Progress Notes (Signed)
Patient ID: [name], female   DOB: 06-15-2001, 22 y.o.   MRN: [id number]  Reason for Consult: vaginal bump   Subjective:  HPI:  [name] is a 22 y.o. female being seen for a "bump" in her vagina. She has been seen recently for UTI symptoms and was treated. Still feeling some urethral pressure. About 1 week ago she had a day of period like bleeding. She is currently [redacted] weeks pregnant based on a 6 week ultrasound. In the last couple of days she noticed mucous like discharge and she felt a bump inside her vagina. The bump is tender when pressure is applied. She is worried that the discharge could be coming from the bump.   Past Medical History:  Diagnosis Date   ADHD (attention deficit hyperactivity disorder)    Anxiety    Asthma    No family history on file. Past Surgical History:  Procedure Laterality Date   WISDOM TOOTH EXTRACTION  2018    Short Social History:  Social History   Tobacco Use   Smoking status: Never   Smokeless tobacco: Never  Substance Use Topics   Alcohol use: No    Allergies  Allergen Reactions   Cephalexin Hives    Current Outpatient Medications  Medication Sig Dispense Refill   Prenatal Vit-Fe Fumarate-FA (MULTIVITAMIN-PRENATAL) 27-0.8 MG TABS tablet Take 1 tablet by mouth daily at 12 noon.     albuterol (VENTOLIN HFA) 108 (90 Base) MCG/ACT inhaler Inhale 2 puffs into the lungs every 6 (six) hours as needed for wheezing or shortness of breath. 8 g 2   No current facility-administered medications for this visit.    Review of Systems  Constitutional:  Negative for chills and fever.  HENT:  Negative for congestion, ear discharge, ear pain, hearing loss, sinus pain and sore throat.   Eyes:  Negative for blurred vision and double vision.  Respiratory:  Negative for cough, shortness of breath and wheezing.   Cardiovascular:  Negative for chest pain, palpitations and leg swelling.  Gastrointestinal:  Negative for abdominal pain, blood in  stool, constipation, diarrhea, heartburn, melena, nausea and vomiting.  Genitourinary:  Negative for dysuria, flank pain, frequency, hematuria and urgency.       Positive for vaginal bump and discomfort around urethra  Musculoskeletal:  Negative for back pain, joint pain and myalgias.  Skin:  Negative for itching and rash.  Neurological:  Negative for dizziness, tingling, tremors, sensory change, speech change, focal weakness, seizures, loss of consciousness, weakness and headaches.  Endo/Heme/Allergies:  Negative for environmental allergies. Does not bruise/bleed easily.  Psychiatric/Behavioral:  Negative for depression, hallucinations, memory loss, substance abuse and suicidal ideas. The patient is not nervous/anxious and does not have insomnia.         Objective:  Objective   Vitals:   04/01/23 1555  BP: 121/79  Pulse: 85  Weight: 190 lb 4.8 oz (86.3 kg)   Body mass index is 33.71 kg/m. Constitutional: Well nourished, well developed female in no acute distress.  HEENT: normal Skin: Warm and dry.  Cardiovascular: Regular rate and rhythm.   Extremity:  no edema   Respiratory: Normal respiratory effort Psych: Alert and Oriented x3. No memory deficits. Normal mood and affect.    Pelvic exam:  is not limited by body habitus EGBUS: within normal limits Vagina: exam done with sterile speculum, normal appearing white discharge at back of vaginal vault, vaginal walls are intact and no obvious bump is seen on speculum exam. A ropey lump  is felt on manual exam in lower right vaginal wall that could be part of hymenal ring. Mild tenderness to palpation. No lesions. Normal rugae.   Assessment/Plan:     22 y.o. with normal vaginal exam  Return for NOB visits Return for worsening symptoms   Tresea Mall, CNM [facility] 04/01/2023 5:46 PM

## 2023-04-23 ENCOUNTER — Telehealth: Payer: Self-pay | Admitting: Certified Nurse Midwife

## 2023-04-23 ENCOUNTER — Encounter: Payer: Medicaid Other | Admitting: Certified Nurse Midwife

## 2023-04-23 NOTE — Telephone Encounter (Signed)
Reached out to pt to reschedule NOB appt that was scheduled on 04/23/2023 at 9:55 with J. Keitha Butte.  Could not leave a message, bc mailbox was not set up.

## 2023-04-24 ENCOUNTER — Encounter: Payer: Self-pay | Admitting: Certified Nurse Midwife

## 2023-04-24 NOTE — Telephone Encounter (Signed)
Reached out to pt (2x) to reschedule NOB appt that was scheduled on 04/23/2023 at 9:55 with J. Keitha Butte. Left message for pt to call back to reschedule.  Will send a MyChart letter.

## 2023-04-25 LAB — PANORAMA PRENATAL TEST FULL PANEL:PANORAMA TEST PLUS 5 ADDITIONAL MICRODELETIONS: FETAL FRACTION: 6.8

## 2023-07-10 ENCOUNTER — Encounter: Payer: Self-pay | Admitting: Obstetrics and Gynecology

## 2023-07-10 ENCOUNTER — Observation Stay
Admission: EM | Admit: 2023-07-10 | Discharge: 2023-07-10 | Disposition: A | Discharge status: Home / Self Care | Payer: Medicaid Other | Attending: Obstetrics | Admitting: Obstetrics

## 2023-07-10 ENCOUNTER — Other Ambulatory Visit: Payer: Self-pay

## 2023-07-10 DIAGNOSIS — O368320 Maternal care for abnormalities of the fetal heart rate or rhythm, second trimester, not applicable or unspecified: Secondary | ICD-10-CM | POA: Diagnosis not present

## 2023-07-10 DIAGNOSIS — R109 Unspecified abdominal pain: Secondary | ICD-10-CM

## 2023-07-10 DIAGNOSIS — Z3A23 23 weeks gestation of pregnancy: Secondary | ICD-10-CM | POA: Insufficient documentation

## 2023-07-10 DIAGNOSIS — R1084 Generalized abdominal pain: Secondary | ICD-10-CM | POA: Diagnosis not present

## 2023-07-10 DIAGNOSIS — O26892 Other specified pregnancy related conditions, second trimester: Secondary | ICD-10-CM | POA: Diagnosis present

## 2023-07-10 LAB — CHLAMYDIA/NGC RT PCR (ARMC ONLY)
Chlamydia Tr: NOT DETECTED
N gonorrhoeae: NOT DETECTED

## 2023-07-10 LAB — URINALYSIS, ROUTINE W REFLEX MICROSCOPIC
Bilirubin Urine: NEGATIVE
Glucose, UA: NEGATIVE mg/dL
Hgb urine dipstick: NEGATIVE
Ketones, ur: NEGATIVE mg/dL
Nitrite: NEGATIVE
Protein, ur: NEGATIVE mg/dL
Specific Gravity, Urine: 1.006 (ref 1.005–1.030)
pH: 6 (ref 5.0–8.0)

## 2023-07-10 LAB — WET PREP, GENITAL
Clue Cells Wet Prep HPF POC: NONE SEEN
Sperm: NONE SEEN
Trich, Wet Prep: NONE SEEN
WBC, Wet Prep HPF POC: <10 (ref <10)
Yeast Wet Prep HPF POC: NONE SEEN

## 2023-07-10 MED ORDER — ACETAMINOPHEN 325 MG PO TABS
650.0000 mg | ORAL_TABLET | ORAL | Status: DC | PRN
  Administered 2023-07-10: 650 mg via ORAL
  Filled 2023-07-10: qty 2

## 2023-07-10 MED ORDER — ONDANSETRON HCL 4 MG/2ML IJ SOLN: 4.0000 mg | Freq: Four times a day (QID) | INTRAMUSCULAR | Status: DC | PRN

## 2023-07-10 MED ORDER — CALCIUM CARBONATE ANTACID 500 MG PO CHEW: 2.0000 | CHEWABLE_TABLET | ORAL | Status: DC | PRN

## 2023-07-10 MED ORDER — LACTATED RINGERS IV SOLN: 500.0000 mL | INTRAVENOUS | Status: DC | PRN

## 2023-07-10 MED ORDER — SOD CITRATE-CITRIC ACID 500-334 MG/5ML PO SOLN: 30.0000 mL | ORAL | Status: DC | PRN

## 2023-07-10 NOTE — OB Triage Note (Signed)
Patient is a 22 yo, G2P1, at 23 weeks and 2 days. Patient presents with complaints of abdominal cramping.  Pt reports having intermittent abd cramping that started around 0000. Pt reports thinking her mucous plug came out. Patient denies any vaginal bleeding or LOF. Patient reports +FM. Monitors applied and assessing. Initial fetal heart tone is 130. M. Swanson CNM notified of patients arrival to unit. Plan to do a G/C swab and wet prep.

## 2023-07-10 NOTE — OB Triage Note (Signed)
LABOR & DELIVERY OB TRIAGE NOTE  SUBJECTIVE  HPI [name] is a 22 y.o. G2P1 at [redacted]w[redacted]d who presents to Labor & Delivery for abdominal cramping. She states that the cramping started around MN and got worse around 0300. She describes the pain as pelvic pressure with cramps in her back. When wiping, she noticed some thick mucus with pink in it. She was diagnosed with a UTI. She denies recent IC. She denies N/V/D and reports that her last BM was a few days ago. She gets prenatal care at [facility] but was told by the on call CNM to come to [facility] for evaluation.  OB History     Gravida  2   Para  1   Term      Preterm      AB      Living         SAB      IAB      Ectopic      Multiple      Live Births              Scheduled Meds: Continuous Infusions:  lactated ringers     PRN Meds:.acetaminophen, calcium carbonate, lactated ringers, ondansetron, sodium citrate-citric acid  OBJECTIVE  BP 133/75 (BP Location: Left Arm)   Pulse (!) 107   Temp 99 F (37.2 C) (Oral)   Resp 16   LMP 01/16/2023 (Approximate)   General: alert, cooperative, NAD Abdomen: soft, gravid, non-tender [id number], thick, moderate amount of white discharge, no blood  Wet prep: negative GC/chlamydia pending UA unremarkable  FHT: 130 Toco: irritability  ASSESSMENT Impression  1) Pregnancy at G2P1, [redacted]w[redacted]d, Estimated Date of Delivery: 11/04/23 2) Reassuring maternal/fetal status 3) Uterine irritability  PLAN  1) Discharge home with standard labor/return precautions 2) Continue abx to UTI 3) Encouraged rest, hydration, comfort measures for constipation, warm bath 4) Keep scheduled ROB appt  Guadlupe Spanish, CNM 07/10/23  6:11 AM

## 2023-08-13 ENCOUNTER — Telehealth: Payer: Self-pay | Admitting: Licensed Clinical Social Worker

## 2023-08-13 NOTE — Telephone Encounter (Signed)
08/08/23 Esmeralda Links, RN  Kathreen Cosier, LCSW Good Morning Marchelle Folks, I am currently working with this client. She is currently experiencing some anxiety and OCD. She states that her OB care provider did a referral to the [facility] however she states there is a 6 week waitlist. Would you reach out to her for counseling until she can get in to [facility] clinic?    Thank You so much, Cassandra
